# Patient Record
Sex: Male | Born: 1960 | Hispanic: Yes | Marital: Married
Health system: Southern US, Community
[De-identification: ages and names within clinical notes are randomized; demographics above are authoritative.]

---

## 2007-08-20 ENCOUNTER — Ambulatory Visit: Payer: Self-pay | Admitting: Surgery

## 2011-03-09 ENCOUNTER — Emergency Department: Payer: Self-pay | Admitting: Emergency Medicine

## 2012-04-16 ENCOUNTER — Emergency Department: Payer: Self-pay | Admitting: Emergency Medicine

## 2013-12-06 IMAGING — CR DG CHEST 2V
1 series · 3 of 3 positions shown · non-contrast
Comparison: none

REASON FOR EXAM: cough
COMMENTS:

[Series 1: w chest pa · 0.14mm/px · 3 of 3 slices shown]
[im 1/3]
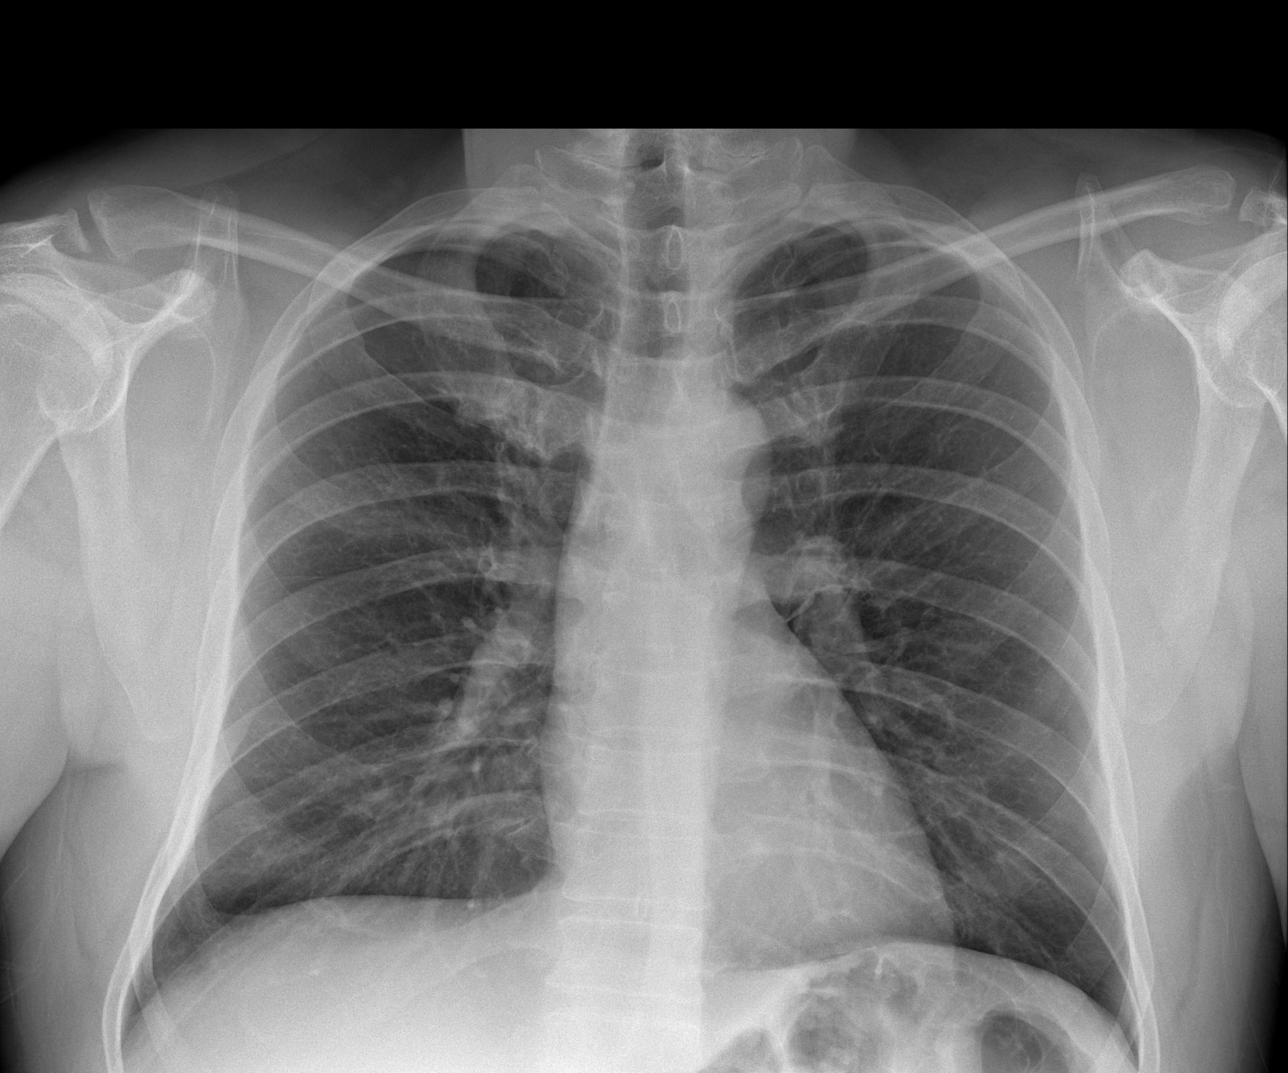
[im 2/3]
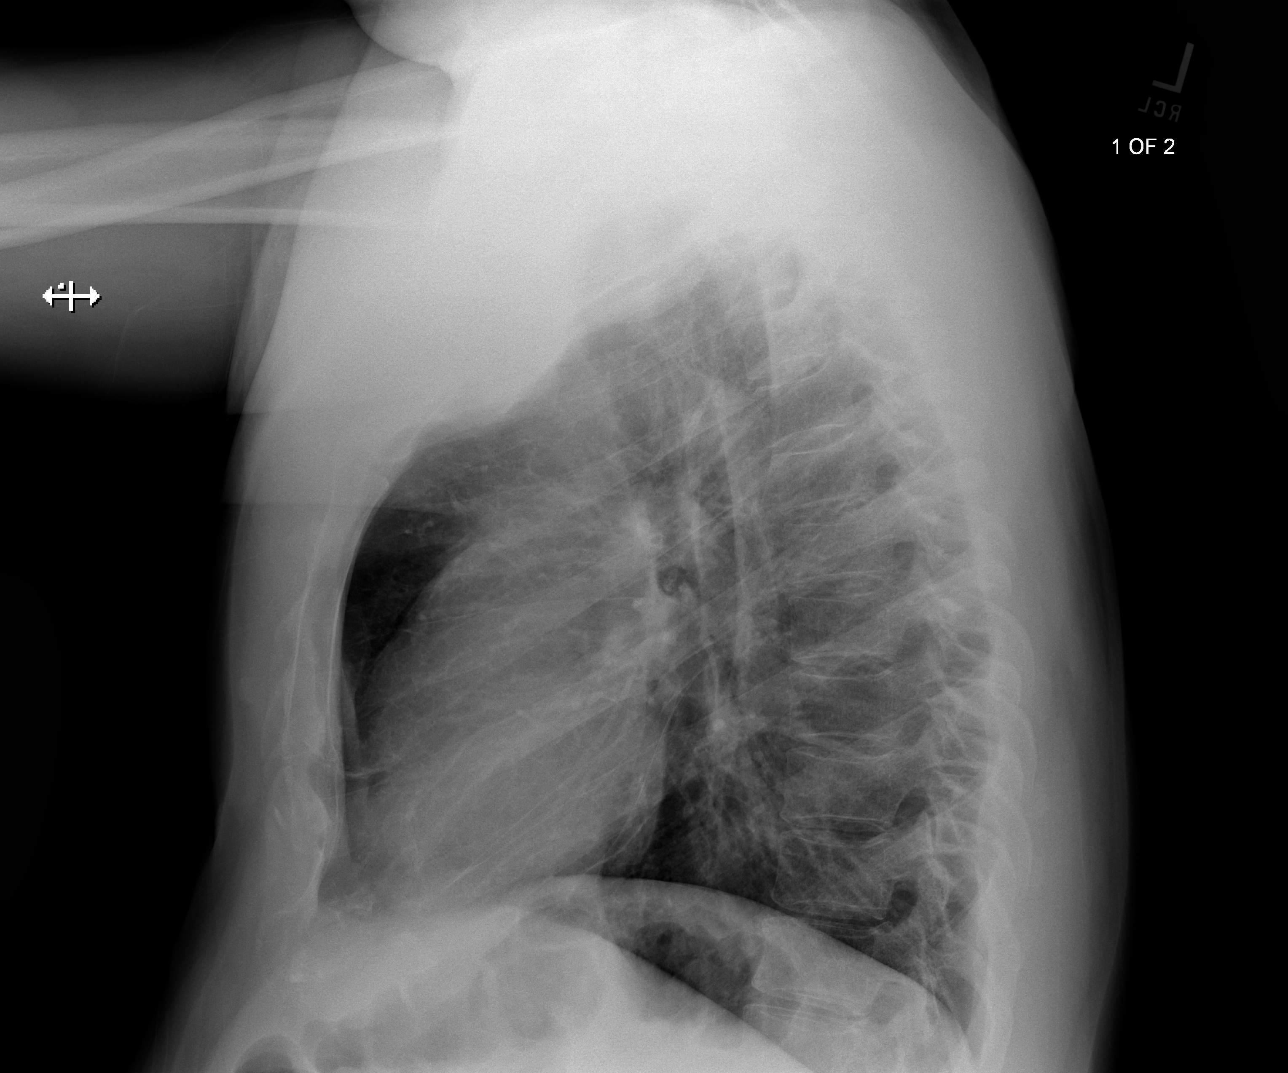
[im 3/3]
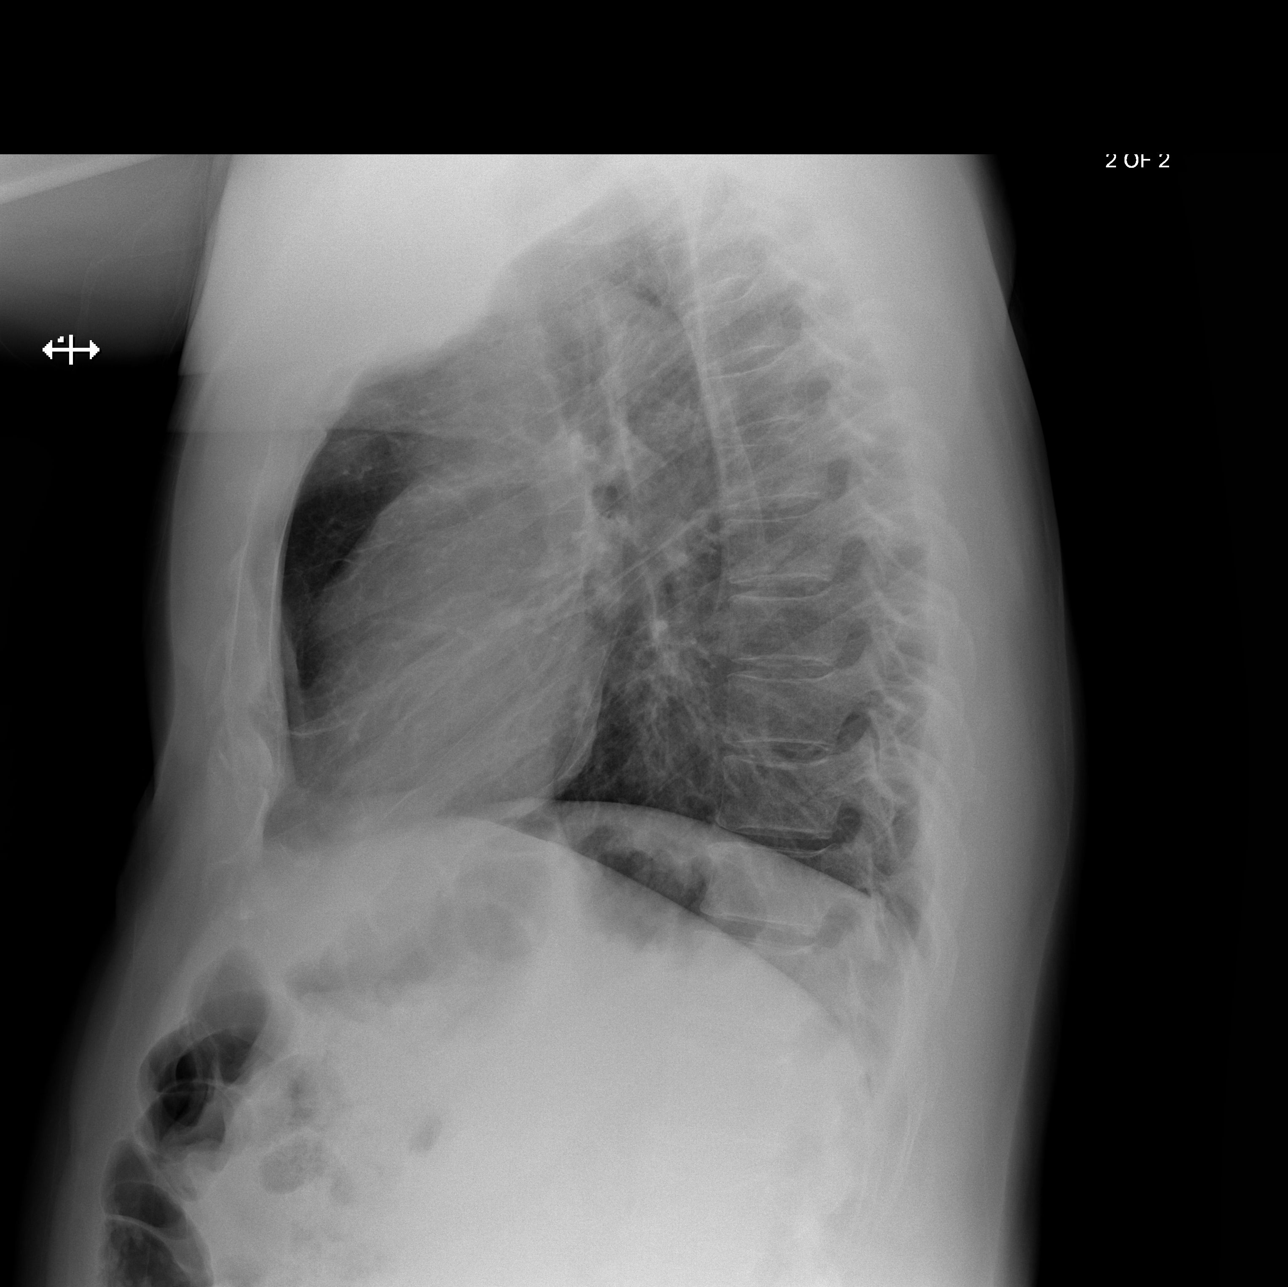

[3 of 3 positions shown; findings below may reference images not displayed]

PROCEDURE:     DXR - DXR CHEST PA (OR AP) AND LATERAL  - April 16, 2012  [DATE]

RESULT:     Comparison is made to the previous exam of 09 March, 2011.

The lungs are clear. The heart and pulmonary vessels are normal. The bony
and mediastinal structures are unremarkable. There is no effusion. There is
no pneumothorax or evidence of congestive failure.
IMPRESSION: No acute cardiopulmonary disease.

[REDACTED]

## 2016-01-18 ENCOUNTER — Other Ambulatory Visit: Payer: Self-pay | Admitting: Student

## 2016-01-18 ENCOUNTER — Ambulatory Visit
Admission: RE | Admit: 2016-01-18 | Discharge: 2016-01-18 | Disposition: A | Discharge status: Home / Self Care | Payer: BLUE CROSS/BLUE SHIELD | Source: Ambulatory Visit | Attending: Student | Admitting: Student

## 2016-01-18 ENCOUNTER — Emergency Department
Admission: EM | Admit: 2016-01-18 | Discharge: 2016-01-18 | Disposition: A | Discharge status: Home / Self Care | Payer: Self-pay

## 2016-01-18 DIAGNOSIS — R059 Cough, unspecified: Secondary | ICD-10-CM

## 2016-01-18 DIAGNOSIS — R05 Cough: Secondary | ICD-10-CM

## 2016-01-18 DIAGNOSIS — R0602 Shortness of breath: Secondary | ICD-10-CM | POA: Diagnosis not present

## 2016-01-18 DIAGNOSIS — R918 Other nonspecific abnormal finding of lung field: Secondary | ICD-10-CM | POA: Diagnosis not present

## 2016-01-18 NOTE — ED Notes (Addendum)
Per Spanish video interpreter Ivy Lynn [id number]), Pt sent here by Bernestine Amass for outpatient chest xray; after talking to interpreter and patient, pt supposed to have outpatient charge.

## 2017-09-08 IMAGING — CR DG CHEST 2V
1 series · 2 of 2 positions shown · non-contrast
Comparison: Chest x-ray of April 16, 2012 and March 09, 2011.

CLINICAL DATA: One month of cough and shortness of breath

EXAM:
CHEST  2 VIEW

[Series 1: dg chest 2 view · 0.14mm/px · 2 of 2 slices shown]
[im 1/2]
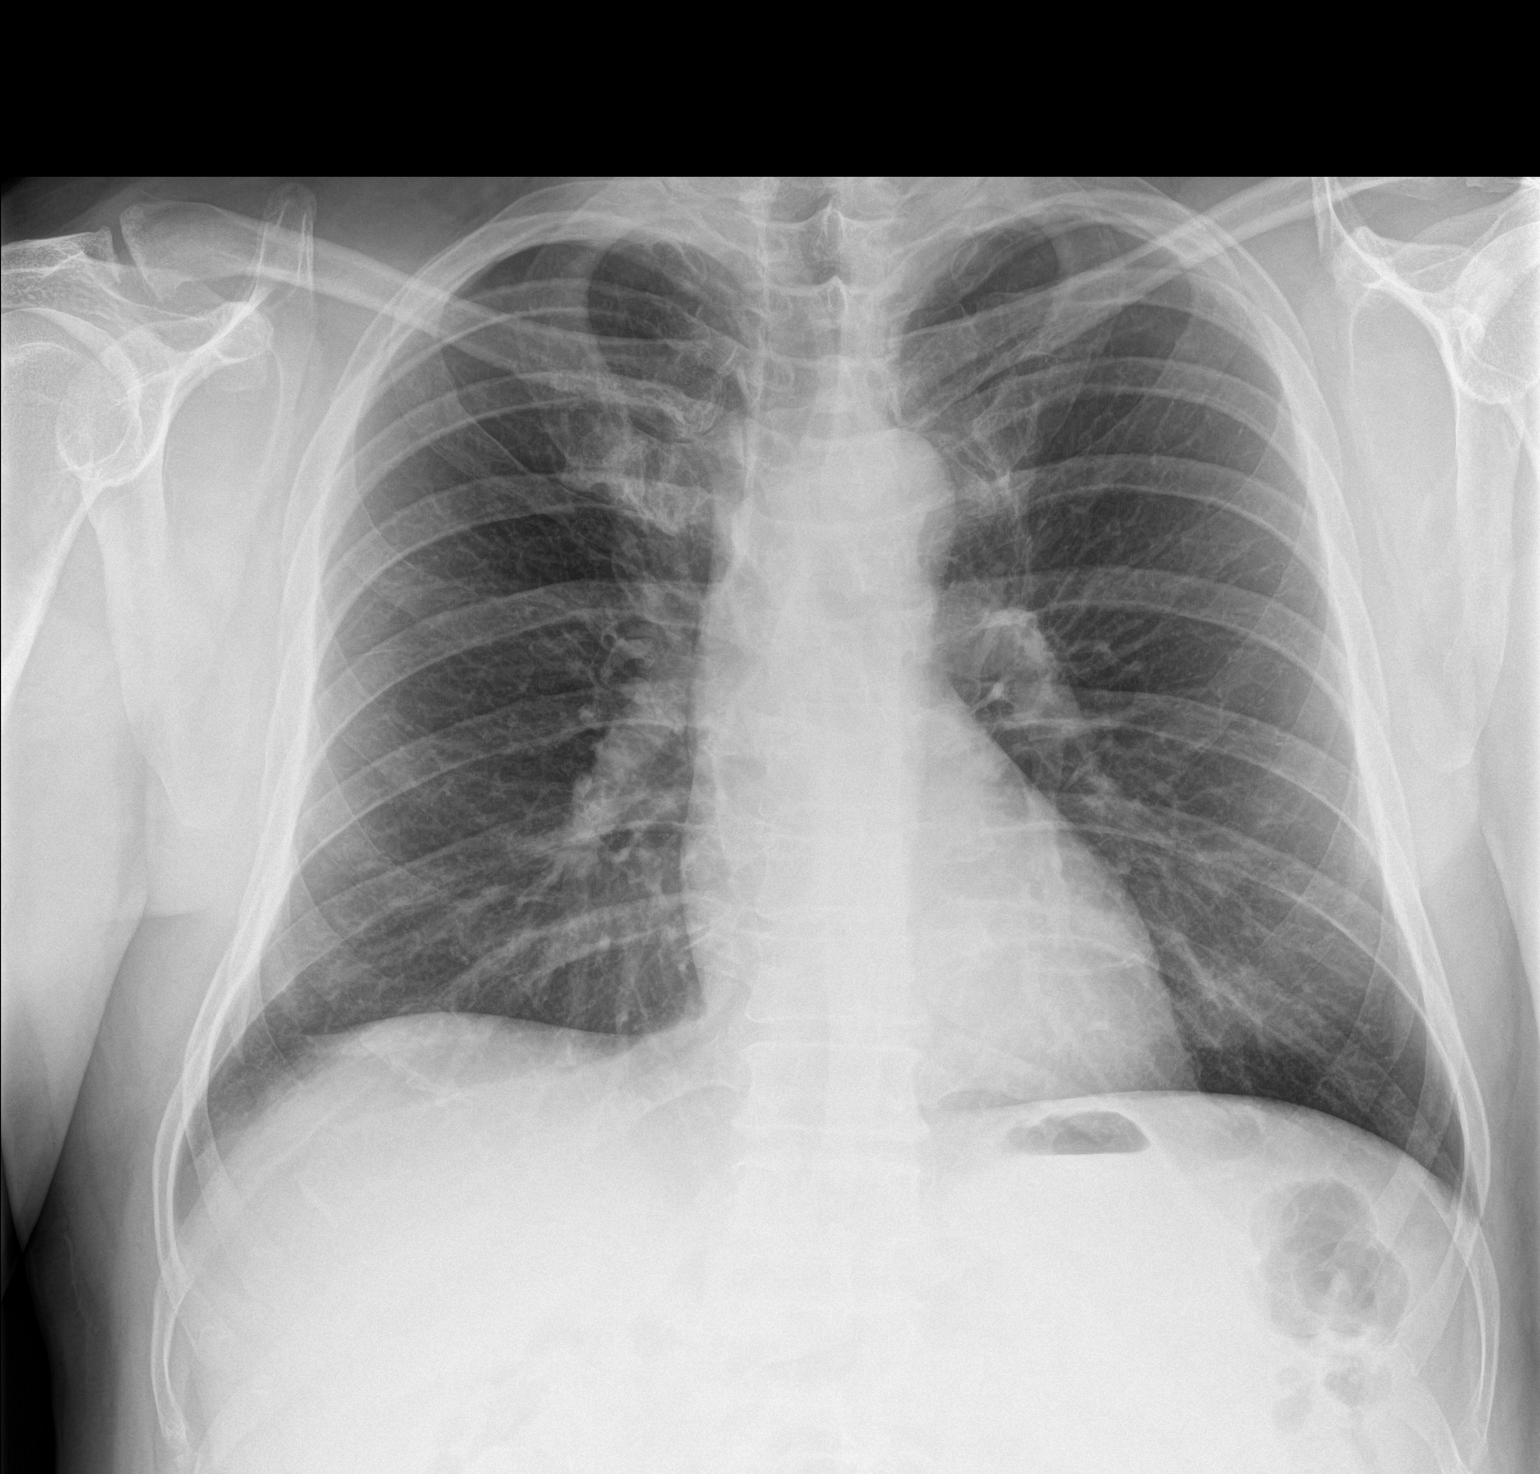
[im 2/2]
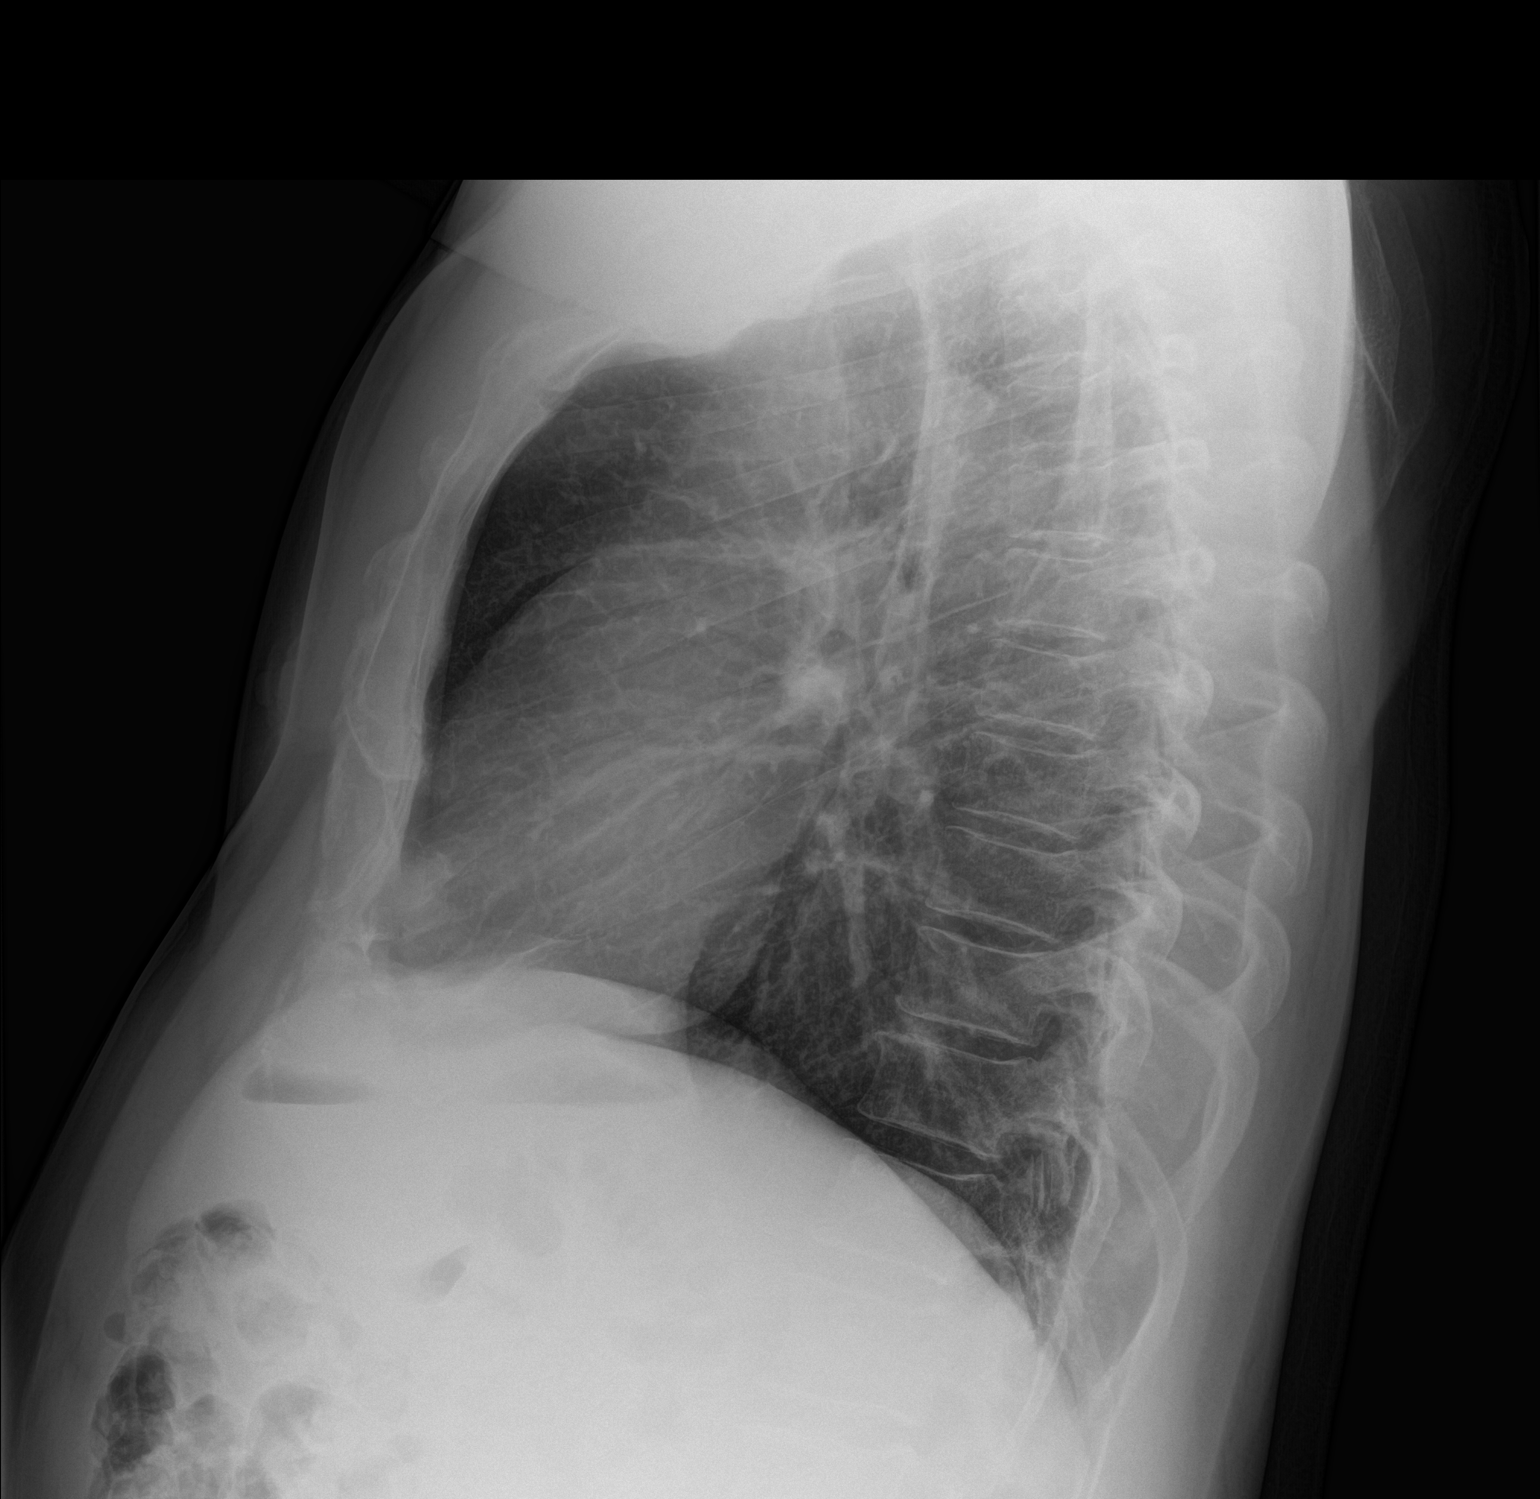

[2 of 2 positions shown; findings below may reference images not displayed]

FINDINGS: The lungs are adequately inflated. There is mildly increased density
inferiorly in the retrocardiac region on the lateral image. This may
lie just lateral to the left heart border on the frontal image.
There is no pleural effusion. The heart and pulmonary vascularity
are normal. The mediastinum is normal in width. The bony thorax
exhibits no acute abnormality.
IMPRESSION: Subsegmental atelectasis or early infiltrate in the extreme anterior
aspect of the left lower lobe or lateral aspect of the lingula.

Followup PA and lateral chest X-ray is recommended in 3-4 weeks
following trial of antibiotic therapy to ensure resolution and
exclude underlying malignancy.
